# Patient Record
Sex: Female | Born: 2000
Health system: Southern US, Community
[De-identification: ages and names within clinical notes are randomized; demographics above are authoritative.]

## PROBLEM LIST (undated history)

## (undated) DIAGNOSIS — J45909 Unspecified asthma, uncomplicated: Secondary | ICD-10-CM

---

## 2019-02-05 ENCOUNTER — Other Ambulatory Visit: Payer: Self-pay

## 2019-02-05 ENCOUNTER — Ambulatory Visit (HOSPITAL_COMMUNITY)
Admission: EM | Admit: 2019-02-05 | Discharge: 2019-02-05 | Disposition: A | Discharge status: Home / Self Care | Payer: 59 | Attending: Family Medicine | Admitting: Family Medicine

## 2019-02-05 ENCOUNTER — Encounter (HOSPITAL_COMMUNITY): Payer: Self-pay | Admitting: Emergency Medicine

## 2019-02-05 DIAGNOSIS — Z20828 Contact with and (suspected) exposure to other viral communicable diseases: Secondary | ICD-10-CM | POA: Diagnosis not present

## 2019-02-05 DIAGNOSIS — R1084 Generalized abdominal pain: Secondary | ICD-10-CM | POA: Diagnosis not present

## 2019-02-05 DIAGNOSIS — Z3202 Encounter for pregnancy test, result negative: Secondary | ICD-10-CM

## 2019-02-05 DIAGNOSIS — Z03818 Encounter for observation for suspected exposure to other biological agents ruled out: Secondary | ICD-10-CM | POA: Diagnosis present

## 2019-02-05 DIAGNOSIS — Z20822 Contact with and (suspected) exposure to covid-19: Secondary | ICD-10-CM

## 2019-02-05 LAB — POCT URINALYSIS DIP (DEVICE)
Glucose, UA: NEGATIVE mg/dL
Ketones, ur: 80 mg/dL — AB
Leukocytes,Ua: NEGATIVE
Nitrite: NEGATIVE
Protein, ur: 100 mg/dL — AB
Specific Gravity, Urine: >=1.03 (ref 1.005–1.030)
Urobilinogen, UA: 1 mg/dL (ref 0.0–1.0)
pH: 6 (ref 5.0–8.0)

## 2019-02-05 LAB — POC URINE PREG, ED: Preg Test, Ur: NEGATIVE

## 2019-02-05 LAB — POCT PREGNANCY, URINE: Preg Test, Ur: NEGATIVE

## 2019-02-05 MED ORDER — DICYCLOMINE HCL 20 MG PO TABS: 20.0000 mg | ORAL_TABLET | Freq: Two times a day (BID) | ORAL | 0 refills | Status: DC

## 2019-02-05 NOTE — ED Provider Notes (Signed)
[facility]    CSN: [id number] Arrival date & time: 02/05/19  1825      History   Chief Complaint Chief Complaint  Patient presents with  . Abdominal Pain    HPI Karla Cross is a 18 y.o. female.   UXNATFTD Karla Cross 41 y old female presented to the urgent care with a complaint of abdominal pain that started 1day ago.  Denies a precipitating event, or specific injury.  Patient localizes pain to her abdomen.  Describes it as constant and achy, cramping in character.  Has tried OTC Ibuprofen without relief.  Symptoms are made worse with eating.  Did not reports similar symptoms in the past.  Denies fever, chills, appetite change, weight change, chest pain, nausea, vomiting, changes in bowel or bladder habits.  The history is provided by the patient. No language interpreter was used.    History reviewed. No pertinent past medical history.  There are no problems to display for this patient.   History reviewed. No pertinent surgical history.  OB History   No obstetric history on file.      Home Medications    Prior to Admission medications   Medication Sig Start Date End Date Taking? Authorizing Provider  dicyclomine (BENTYL) 20 MG tablet Take 1 tablet (20 mg total) by mouth 2 (two) times daily. 02/05/19   Durward Parcel, FNP    Family History History reviewed. No pertinent family history.  Social History Social History   Tobacco Use  . Smoking status: Never Smoker  . Smokeless tobacco: Never Used  Substance Use Topics  . Alcohol use: Not on file  . Drug use: Not on file     Allergies   Patient has no known allergies.   Review of Systems Review of Systems  Constitutional: Negative.   Respiratory: Positive for chest tightness.   Cardiovascular: Negative.   Gastrointestinal: Positive for abdominal pain.  ROS: All other are negatives   Physical Exam Triage Vital Signs ED Triage Vitals  Enc Vitals Group     BP      Pulse      Resp      Temp      Temp src      SpO2      Weight      Height      Head Circumference      Peak Flow      Pain Score      Pain Loc      Pain Edu?      Excl. in GC?    No data found.  Updated Vital Signs BP 131/83 (BP Location: Right Arm)   Pulse 77   Temp 98.6 F (37 C) (Oral)   Resp 14   LMP 02/05/2019   SpO2 100%   Visual Acuity Right Eye Distance:   Left Eye Distance:   Bilateral Distance:    Right Eye Near:   Left Eye Near:    Bilateral Near:     Physical Exam Constitutional:      General: She is not in acute distress.    Appearance: Normal appearance. She is well-developed and normal weight. She is not ill-appearing or toxic-appearing.  HENT:     Head: Normocephalic.     Right Ear: Tympanic membrane, ear canal and external ear normal. There is no impacted cerumen.     Left Ear: Tympanic membrane, ear canal and external ear normal. There is no impacted cerumen.     Nose: Nose normal.  No congestion.     Mouth/Throat:     Mouth: Mucous membranes are moist.     Pharynx: No oropharyngeal exudate or posterior oropharyngeal erythema.  Cardiovascular:     Rate and Rhythm: Normal rate and regular rhythm.     Pulses: Normal pulses.     Heart sounds: Normal heart sounds. No murmur.  Pulmonary:     Effort: Pulmonary effort is normal. No respiratory distress.     Breath sounds: No wheezing or rhonchi.  Chest:     Chest wall: No tenderness.  Abdominal:     General: Abdomen is flat. Bowel sounds are normal. There is no distension.     Palpations: Abdomen is soft. There is no mass.     Tenderness: There is generalized abdominal tenderness.  Skin:    Capillary Refill: Capillary refill takes less than 2 seconds.  Neurological:     Mental Status: She is alert and oriented to person, place, and time.      UC Treatments / Results  Labs (all labs ordered are listed, but only abnormal results are displayed) Labs Reviewed  POCT URINALYSIS DIP (DEVICE) - Abnormal; Notable  for the following components:      Result Value   Bilirubin Urine MODERATE (*)    Ketones, ur 80 (*)    Hgb urine dipstick LARGE (*)    Protein, ur 100 (*)    All other components within normal limits  URINE CULTURE  NOVEL CORONAVIRUS, NAA (HOSP ORDER, SEND-OUT TO REF LAB; TAT 18-24 HRS)  POC URINE PREG, ED  POC URINE PREG, ED  POCT PREGNANCY, URINE    EKG   Radiology No results found.  Procedures Procedures (including critical care time)  Medications Ordered in UC Medications - No data to display  Initial Impression / Assessment and Plan / UC Course  I have reviewed the triage vital signs and the nursing notes.  Pertinent labs & imaging results that were available during my care of the patient were reviewed by me and considered in my medical decision making (see chart for details).    Urine analysis was inconclusive. Will send urine for culture.  Urine pregnancy test was negative.  COVID-19 test was ordered we will call if result is abnormal.  Will prescribe Bentyl for abdominal pain.  Return for worsening of symptoms.  Final Clinical Impressions(s) / UC Diagnoses   Final diagnoses:  Generalized abdominal pain  COVID-19 ruled out     Discharge Instructions     Urine will be sent for culture Will call on results become available and if abnormal COVID testing ordered.  It will take between 5-7 days for test results.  Someone will contact you regarding abnormal results.    In the meantime: You should remain isolated in your home for 10 days from symptom onset  Get plenty of rest and push fluids Use medications daily for symptom relief Use OTC medications like ibuprofen or tylenol as needed fever or pain Call or go to the ED if you have any new or worsening symptoms such as fever, worsening cough, shortness of breath, chest tightness, chest pain, turning blue, changes in mental status, etc...     ED Prescriptions    Medication Sig Dispense Auth. Provider    dicyclomine (BENTYL) 20 MG tablet Take 1 tablet (20 mg total) by mouth 2 (two) times daily. 20 tablet Tashera Montalvo, Darrelyn Hillock, FNP     PDMP not reviewed this encounter.   Emerson Monte, FNP 02/05/19 2006

## 2019-02-05 NOTE — ED Triage Notes (Signed)
Pt here for abd pain onset last night associated w/n/v/d  Denies fevers  A&O x4... NAD.Marland Kitchen. ambulatory

## 2019-02-05 NOTE — Discharge Instructions (Signed)
Urine will be sent for culture Will call on results become available and if abnormal COVID testing ordered.  It will take between 5-7 days for test results.  Someone will contact you regarding abnormal results.    In the meantime: You should remain isolated in your home for 10 days from symptom onset  Get plenty of rest and push fluids Use medications daily for symptom relief Use OTC medications like ibuprofen or tylenol as needed fever or pain Call or go to the ED if you have any new or worsening symptoms such as fever, worsening cough, shortness of breath, chest tightness, chest pain, turning blue, changes in mental status, etc..Marland Kitchen

## 2019-02-07 LAB — NOVEL CORONAVIRUS, NAA (HOSP ORDER, SEND-OUT TO REF LAB; TAT 18-24 HRS): SARS-CoV-2, NAA: NOT DETECTED

## 2019-02-07 LAB — URINE CULTURE: Culture: <10000 — AB

## 2020-01-22 ENCOUNTER — Other Ambulatory Visit: Payer: Self-pay

## 2020-01-22 ENCOUNTER — Emergency Department (HOSPITAL_COMMUNITY)
Admission: EM | Admit: 2020-01-22 | Discharge: 2020-01-23 | Disposition: A | Discharge status: Home / Self Care | Payer: Medicaid Other | Attending: Emergency Medicine | Admitting: Emergency Medicine

## 2020-01-22 ENCOUNTER — Encounter (HOSPITAL_COMMUNITY): Payer: Self-pay | Admitting: Emergency Medicine

## 2020-01-22 DIAGNOSIS — R21 Rash and other nonspecific skin eruption: Secondary | ICD-10-CM | POA: Diagnosis not present

## 2020-01-22 DIAGNOSIS — Z5321 Procedure and treatment not carried out due to patient leaving prior to being seen by health care provider: Secondary | ICD-10-CM | POA: Diagnosis not present

## 2020-01-22 NOTE — ED Triage Notes (Signed)
Patient reports generalized itchy skin rashes for 1 week , respirations unlabored /no oral swelling , denies fever or chills .

## 2020-01-23 NOTE — ED Notes (Signed)
Pt left due to not being seen quick enough 

## 2020-01-24 ENCOUNTER — Ambulatory Visit (HOSPITAL_COMMUNITY): Admit: 2020-01-24 | Discharge status: Against Medical Advice | Payer: Self-pay

## 2020-01-24 ENCOUNTER — Encounter: Payer: Self-pay | Admitting: Emergency Medicine

## 2020-01-24 ENCOUNTER — Ambulatory Visit
Admission: EM | Admit: 2020-01-24 | Discharge: 2020-01-24 | Disposition: A | Discharge status: Home / Self Care | Payer: Medicaid Other | Attending: Emergency Medicine | Admitting: Emergency Medicine

## 2020-01-24 ENCOUNTER — Other Ambulatory Visit: Payer: Self-pay

## 2020-01-24 DIAGNOSIS — R21 Rash and other nonspecific skin eruption: Secondary | ICD-10-CM

## 2020-01-24 MED ORDER — TRIAMCINOLONE ACETONIDE 0.1 % EX CREA: 1.0000 "application " | TOPICAL_CREAM | Freq: Two times a day (BID) | CUTANEOUS | 0 refills | Status: DC

## 2020-01-24 MED ORDER — PERMETHRIN 5 % EX CREA: TOPICAL_CREAM | CUTANEOUS | 0 refills | Status: DC

## 2020-01-24 NOTE — ED Provider Notes (Signed)
[facility]    CSN: [id number] Arrival date & time: 01/24/20  1324      History   Chief Complaint Chief Complaint  Patient presents with  . Rash    HPI Karla Cross is a 19 y.o. female presenting today for evaluation of a rash.  Patient reports that she has had generalized skin itching and rash for approximately 1 week.  Initially had area to left upper arm that has since resolved, lasted for approximately 3 days.  Has developed similar to left medial thigh.  Concerned as roommate also now with a rash.  Reports history of eczema.  Denies any new exposures or hygiene products.  HPI  History reviewed. No pertinent past medical history.  There are no problems to display for this patient.   History reviewed. No pertinent surgical history.  OB History   No obstetric history on file.      Home Medications    Prior to Admission medications   Medication Sig Start Date End Date Taking? Authorizing Provider  dicyclomine (BENTYL) 20 MG tablet Take 1 tablet (20 mg total) by mouth 2 (two) times daily. 02/05/19   Avegno, Zachery Dakins, FNP  permethrin (ELIMITE) 5 % cream Apply to affected area once 01/24/20   Amiracle Neises C, PA-C  triamcinolone (KENALOG) 0.1 % Apply 1 application topically 2 (two) times daily. 01/24/20   Jennifer Payes, Junius Creamer, PA-C    Family History Family History  Problem Relation Age of Onset  . Healthy Mother     Social History Social History   Tobacco Use  . Smoking status: Never Smoker  . Smokeless tobacco: Never Used  Substance Use Topics  . Alcohol use: Never  . Drug use: Never     Allergies   Patient has no known allergies.   Review of Systems Review of Systems  Constitutional: Negative for fatigue and fever.  HENT: Negative for mouth sores.   Eyes: Negative for visual disturbance.  Respiratory: Negative for shortness of breath.   Cardiovascular: Negative for chest pain.  Gastrointestinal: Negative for abdominal pain,  nausea and vomiting.  Genitourinary: Negative for genital sores.  Musculoskeletal: Negative for arthralgias and joint swelling.  Skin: Positive for color change and rash. Negative for wound.  Neurological: Negative for dizziness, weakness, light-headedness and headaches.     Physical Exam Triage Vital Signs ED Triage Vitals  Enc Vitals Group     BP      Pulse      Resp      Temp      Temp src      SpO2      Weight      Height      Head Circumference      Peak Flow      Pain Score      Pain Loc      Pain Edu?      Excl. in GC?    No data found.  Updated Vital Signs BP 122/79 (BP Location: Left Arm)   Pulse 70   Temp 98.1 F (36.7 C) (Oral)   Resp 16   LMP 01/15/2020   SpO2 99%   Visual Acuity Right Eye Distance:   Left Eye Distance:   Bilateral Distance:    Right Eye Near:   Left Eye Near:    Bilateral Near:     Physical Exam Vitals and nursing note reviewed.  Constitutional:      Appearance: She is well-developed and well-nourished.  Comments: No acute distress  HENT:     Head: Normocephalic and atraumatic.     Nose: Nose normal.  Eyes:     Conjunctiva/sclera: Conjunctivae normal.  Cardiovascular:     Rate and Rhythm: Normal rate.  Pulmonary:     Effort: Pulmonary effort is normal. No respiratory distress.  Abdominal:     General: There is no distension.  Musculoskeletal:        General: Normal range of motion.     Cervical back: Neck supple.  Skin:    General: Skin is warm and dry.     Comments: Faintly papular erythematous rash to localized area of medial thigh, area on left upper arm resolved without scarring  Neurological:     Mental Status: She is alert and oriented to person, place, and time.  Psychiatric:        Mood and Affect: Mood and affect normal.      UC Treatments / Results  Labs (all labs ordered are listed, but only abnormal results are displayed) Labs Reviewed - No data to display  EKG   Radiology No results  found.  Procedures Procedures (including critical care time)  Medications Ordered in UC Medications - No data to display  Initial Impression / Assessment and Plan / UC Course  I have reviewed the triage vital signs and the nursing notes.  Pertinent labs & imaging results that were available during my care of the patient were reviewed by me and considered in my medical decision making (see chart for details).     Unclear cause of rash at this time, remits rash suggestive of possible scabies we will go ahead and cover for this with permethrin.  Triamcinolone topically to help with itching and localized inflammation.  Continue to monitor.  Discussed strict return precautions. Patient verbalized understanding and is agreeable with plan.  Final Clinical Impressions(s) / UC Diagnoses   Final diagnoses:  Rash and nonspecific skin eruption     Discharge Instructions     Please use triamcinolone cream twice daily to rash Follow-up if not improving or worsening  Thoroughly massage cream (30 g for average adult) from head to soles of feet; leave on for 8 to 14 hours before removing (shower or bath); for elderly patients, also apply on the hairline, neck, scalp, temple, and forehead; may repeat if living mites are observed 14 days after first treatment; one application is generally curative.    ED Prescriptions    Medication Sig Dispense Auth. Provider   triamcinolone (KENALOG) 0.1 % Apply 1 application topically 2 (two) times daily. 45 g Ginnette Gates C, PA-C   permethrin (ELIMITE) 5 % cream Apply to affected area once 60 g Cincere Zorn, [place] C, PA-C     PDMP not reviewed this encounter.   Lew Dawes, PA-C 01/24/20 1515

## 2020-01-24 NOTE — Discharge Instructions (Addendum)
Please use triamcinolone cream twice daily to rash Follow-up if not improving or worsening  Thoroughly massage cream (30 g for average adult) from head to soles of feet; leave on for 8 to 14 hours before removing (shower or bath); for elderly patients, also apply on the hairline, neck, scalp, temple, and forehead; may repeat if living mites are observed 14 days after first treatment; one application is generally curative. 

## 2020-01-24 NOTE — ED Triage Notes (Signed)
Pt here for rash to left arm and upper inner thigh x 3 days

## 2020-04-14 ENCOUNTER — Encounter (HOSPITAL_COMMUNITY): Payer: Self-pay | Admitting: Emergency Medicine

## 2020-04-14 ENCOUNTER — Other Ambulatory Visit: Payer: Self-pay

## 2020-04-14 ENCOUNTER — Ambulatory Visit (HOSPITAL_COMMUNITY)
Admission: EM | Admit: 2020-04-14 | Discharge: 2020-04-14 | Disposition: A | Discharge status: Home / Self Care | Payer: Medicaid Other | Attending: Family Medicine | Admitting: Family Medicine

## 2020-04-14 DIAGNOSIS — M79621 Pain in right upper arm: Secondary | ICD-10-CM | POA: Diagnosis not present

## 2020-04-14 DIAGNOSIS — S40021A Contusion of right upper arm, initial encounter: Secondary | ICD-10-CM

## 2020-04-14 NOTE — ED Triage Notes (Signed)
Pt presents today with c/o of right arm pain, bruising described as throbbing x 3 days. Denies injury.

## 2020-04-14 NOTE — ED Provider Notes (Signed)
  [facility]   [id number] 04/14/20 Arrival Time: 1546  ASSESSMENT & PLAN:  1. Pain in right upper arm   2. Arm bruise, right, initial encounter     No hematoma. Discussed typical bruise healing time. Ice if needed. Ibup if needed.    Follow-up Information    [facility].   Specialty: Urgent Care Why: If worsening or failing to improve as anticipated. Contact information: [address] [place] [address] [phone]              Reviewed expectations re: course of current medical issues. Questions answered. Outlined signs and symptoms indicating need for more acute intervention. Understanding verbalized. After Visit Summary given.   SUBJECTIVE: History from: patient. CZYSAYTK Bounds is a 20 y.o. female who reports R upper arm pain over triceps; few days; noted bruise to area yesterday. Does not recall trauma to area. No bruising elsewhere. Feels well.    OBJECTIVE:  Vitals:   04/14/20 1616  BP: 118/69  Pulse: 67  Resp: 20  Temp: 98.2 F (36.8 C)  TempSrc: Oral  SpO2: 100%    General appearance: alert; no distress Eyes: PERRLA; EOMI; conjunctiva normal HENT: IXL; AT Neck: supple  Lungs: speaks full sentences without difficulty; unlabored Extremities: no edema; RUE with bruising over triceps measuring approx 2x4 cm; is TTP; no swelling; no hematoma; no open wounds Skin: warm and dry Neurologic: normal gait Psychological: alert and cooperative; normal mood and affect   No Known Allergies  History reviewed. No pertinent past medical history. Social History   Socioeconomic History  . Marital status: Single    Spouse name: Not on file  . Number of children: Not on file  . Years of education: Not on file  . Highest education level: Not on file  Occupational History  . Not on file  Tobacco Use  . Smoking status: Never Smoker  . Smokeless tobacco: Never Used  Vaping Use  . Vaping Use: Former   Substance and Sexual Activity  . Alcohol use: Never  . Drug use: Never  . Sexual activity: Not on file  Other Topics Concern  . Not on file  Social History Narrative  . Not on file   Social Determinants of Health   Financial Resource Strain: Not on file  Food Insecurity: Not on file  Transportation Needs: Not on file  Physical Activity: Not on file  Stress: Not on file  Social Connections: Not on file  Intimate Partner Violence: Not on file   Family History  Problem Relation Age of Onset  . Healthy Mother    History reviewed. No pertinent surgical history.   Mardella Layman, MD 04/14/20 [phone]

## 2020-04-14 NOTE — Discharge Instructions (Addendum)
If not allergic, you may use over the counter ibuprofen or acetaminophen as needed. ° °

## 2020-05-11 ENCOUNTER — Ambulatory Visit (HOSPITAL_COMMUNITY)
Admission: EM | Admit: 2020-05-11 | Discharge: 2020-05-11 | Disposition: A | Discharge status: Home / Self Care | Payer: Medicaid Other

## 2020-05-11 ENCOUNTER — Encounter (HOSPITAL_COMMUNITY): Payer: Self-pay | Admitting: Emergency Medicine

## 2020-05-11 ENCOUNTER — Ambulatory Visit (INDEPENDENT_AMBULATORY_CARE_PROVIDER_SITE_OTHER): Payer: Medicaid Other

## 2020-05-11 ENCOUNTER — Other Ambulatory Visit: Payer: Self-pay

## 2020-05-11 DIAGNOSIS — R059 Cough, unspecified: Secondary | ICD-10-CM | POA: Diagnosis not present

## 2020-05-11 DIAGNOSIS — J069 Acute upper respiratory infection, unspecified: Secondary | ICD-10-CM

## 2020-05-11 DIAGNOSIS — R079 Chest pain, unspecified: Secondary | ICD-10-CM

## 2020-05-11 DIAGNOSIS — R0789 Other chest pain: Secondary | ICD-10-CM

## 2020-05-11 DIAGNOSIS — F17200 Nicotine dependence, unspecified, uncomplicated: Secondary | ICD-10-CM | POA: Diagnosis not present

## 2020-05-11 DIAGNOSIS — J4530 Mild persistent asthma, uncomplicated: Secondary | ICD-10-CM | POA: Diagnosis not present

## 2020-05-11 HISTORY — DX: Unspecified asthma, uncomplicated: J45.909

## 2020-05-11 MED ORDER — PREDNISONE 20 MG PO TABS: ORAL_TABLET | ORAL | 0 refills | Status: DC

## 2020-05-11 MED ORDER — PROMETHAZINE-DM 6.25-15 MG/5ML PO SYRP: 5.0000 mL | ORAL_SOLUTION | Freq: Every evening | ORAL | 0 refills | Status: DC | PRN

## 2020-05-11 MED ORDER — BENZONATATE 100 MG PO CAPS: 100.0000 mg | ORAL_CAPSULE | Freq: Three times a day (TID) | ORAL | 0 refills | Status: DC | PRN

## 2020-05-11 NOTE — ED Triage Notes (Addendum)
Patient reports cough for 5 days.  Patient complains of head congestion and left lower rib cage pain, swelling?  Denies fever, but having hot and cold sweats

## 2020-05-11 NOTE — ED Provider Notes (Signed)
[facility]   MRN: [id number] DOB: 2000-10-09  Subjective:   KXFGHWEX Karla Cross is a 20 y.o. female presenting for 5-day history of acute onset sinus congestion, productive cough, chest tightness, lower chest pain and swelling, subjective fever, cold sweats.  Patient has a history of asthma, has been using her inhaler.  She also smokes black and milds.  Is not interested in Covid testing.  No current facility-administered medications for this encounter.  Current Outpatient Medications:  .  escitalopram (LEXAPRO) 10 MG tablet, Take 10 mg by mouth daily., Disp: , Rfl:  .  FLOVENT HFA 110 MCG/ACT inhaler, SMARTSIG:2 Puff(s) By Mouth Twice Daily, Disp: , Rfl:  .  PROAIR HFA 108 (90 Base) MCG/ACT inhaler, SMARTSIG:2 Puff(s) By Mouth Every 6 Hours, Disp: , Rfl:    No Known Allergies  Past Medical History:  Diagnosis Date  . Asthma      History reviewed. No pertinent surgical history.  Family History  Problem Relation Age of Onset  . Healthy Mother     Social History   Tobacco Use  . Smoking status: Current Some Day Smoker    Types: Cigarettes  . Smokeless tobacco: Never Used  Vaping Use  . Vaping Use: Former  Substance Use Topics  . Alcohol use: Never  . Drug use: Never    ROS   Objective:   Vitals: BP 118/84 (BP Location: Left Arm)   Pulse 75   Temp 98.5 F (36.9 C) (Oral)   Resp 18   LMP 05/04/2020   SpO2 99%   Physical Exam Constitutional:      General: She is not in acute distress.    Appearance: Normal appearance. She is well-developed. She is not ill-appearing, toxic-appearing or diaphoretic.  HENT:     Head: Normocephalic and atraumatic.     Right Ear: Tympanic membrane, ear canal and external ear normal. No drainage or tenderness. No middle ear effusion. Tympanic membrane is not erythematous.     Left Ear: Tympanic membrane, ear canal and external ear normal. No drainage or tenderness.  No middle ear effusion. Tympanic membrane is not  erythematous.     Nose: Nose normal. No congestion or rhinorrhea.     Mouth/Throat:     Mouth: Mucous membranes are moist. No oral lesions.     Pharynx: No pharyngeal swelling, oropharyngeal exudate, posterior oropharyngeal erythema or uvula swelling.     Tonsils: No tonsillar exudate or tonsillar abscesses.  Eyes:     General: No scleral icterus.       Right eye: No discharge.        Left eye: No discharge.     Extraocular Movements: Extraocular movements intact.     Right eye: Normal extraocular motion.     Left eye: Normal extraocular motion.     Conjunctiva/sclera: Conjunctivae normal.     Pupils: Pupils are equal, round, and reactive to light.  Cardiovascular:     Rate and Rhythm: Normal rate and regular rhythm.     Pulses: Normal pulses.     Heart sounds: Normal heart sounds. No murmur heard. No friction rub. No gallop.   Pulmonary:     Effort: Pulmonary effort is normal. No respiratory distress.     Breath sounds: Normal breath sounds. No stridor. No wheezing, rhonchi or rales.  Musculoskeletal:     Cervical back: Normal range of motion and neck supple.  Lymphadenopathy:     Cervical: No cervical adenopathy.  Skin:    General:  Skin is warm and dry.     Findings: No rash.  Neurological:     General: No focal deficit present.     Mental Status: She is alert and oriented to person, place, and time.  Psychiatric:        Mood and Affect: Mood normal.        Behavior: Behavior normal.        Thought Content: Thought content normal.        Judgment: Judgment normal.    DG Chest 2 View  Result Date: 05/11/2020 CLINICAL DATA:  Chest pain and cough EXAM: CHEST - 2 VIEW COMPARISON:  None. FINDINGS: The lungs are clear. Heart size and pulmonary vascularity are normal. No adenopathy. No pneumothorax. No bone lesions. IMPRESSION: Lungs clear.  Cardiac silhouette normal. Electronically Signed   By: Bretta Bang III M.D.   On: 05/11/2020 15:26     Assessment and Plan :    PDMP not reviewed this encounter.  1. Viral URI with cough   2. Mild persistent asthma without complication   3. Atypical chest pain   4. Smoker     Recommended prednisone course in light of her chest symptoms and productive cough.  Patient declined Covid test.  Emphasized need to quit smoking.  Schedule butyryl inhaler, use supportive care otherwise. Counseled patient on potential for adverse effects with medications prescribed/recommended today, ER and return-to-clinic precautions discussed, patient verbalized understanding.    Wallis Bamberg, [place] 05/11/20 [phone]

## 2020-07-03 ENCOUNTER — Encounter (HOSPITAL_COMMUNITY): Payer: Self-pay | Admitting: Emergency Medicine

## 2020-07-03 ENCOUNTER — Ambulatory Visit (HOSPITAL_COMMUNITY)
Admission: EM | Admit: 2020-07-03 | Discharge: 2020-07-03 | Disposition: A | Discharge status: Home / Self Care | Payer: Medicaid Other | Attending: Family Medicine | Admitting: Family Medicine

## 2020-07-03 ENCOUNTER — Other Ambulatory Visit: Payer: Self-pay

## 2020-07-03 DIAGNOSIS — B9689 Other specified bacterial agents as the cause of diseases classified elsewhere: Secondary | ICD-10-CM | POA: Diagnosis not present

## 2020-07-03 DIAGNOSIS — N760 Acute vaginitis: Secondary | ICD-10-CM | POA: Insufficient documentation

## 2020-07-03 LAB — POCT URINALYSIS DIPSTICK, ED / UC
Bilirubin Urine: NEGATIVE
Glucose, UA: 100 mg/dL — AB
Hgb urine dipstick: NEGATIVE
Leukocytes,Ua: NEGATIVE
Nitrite: NEGATIVE
Protein, ur: NEGATIVE mg/dL
Specific Gravity, Urine: >=1.03 (ref 1.005–1.030)
Urobilinogen, UA: 0.2 mg/dL (ref 0.0–1.0)
pH: 5.5 (ref 5.0–8.0)

## 2020-07-03 LAB — POC URINE PREG, ED: Preg Test, Ur: NEGATIVE

## 2020-07-03 MED ORDER — FLUCONAZOLE 150 MG PO TABS: 150.0000 mg | ORAL_TABLET | ORAL | 0 refills | Status: DC

## 2020-07-03 NOTE — ED Provider Notes (Signed)
[facility]    CSN: [id number] Arrival date & time: 07/03/20  1636      History   Chief Complaint Chief Complaint  Patient presents with  . Vaginal Discharge    HPI Nykerria Courser is a 20 y.o. female.   Patient presenting today with several day history of white discharge, vaginal itching and irritation.  She denies pelvic pain, dysuria, hematuria, flank pain, fever, chills, new sexual partners.  So far doing over-the-counter Monistat with minimal benefit.  States she had something similar in 6 months or so ago and was diagnosed with a yeast infection.  Of note, her menstrual cycle is 3 days late.  She has not taken a pregnancy test yet.     Past Medical History:  Diagnosis Date  . Asthma     There are no problems to display for this patient.   History reviewed. No pertinent surgical history.  OB History   No obstetric history on file.      Home Medications    Prior to Admission medications   Medication Sig Start Date End Date Taking? Authorizing Provider  fluconazole (DIFLUCAN) 150 MG tablet Take 1 tablet (150 mg total) by mouth every other day. May stop once symptoms resolve 07/03/20  Yes Particia Nearing, PA-C  benzonatate (TESSALON) 100 MG capsule Take 1-2 capsules (100-200 mg total) by mouth 3 (three) times daily as needed for cough. Patient not taking: Reported on 07/03/2020 05/11/20   Wallis Bamberg, PA-C  escitalopram (LEXAPRO) 10 MG tablet Take 10 mg by mouth daily. Patient not taking: Reported on 07/03/2020 05/03/20   [provider]  FLOVENT HFA 110 MCG/ACT inhaler SMARTSIG:2 Puff(s) By Mouth Twice Daily 05/05/20   [provider]  predniSONE (DELTASONE) 20 MG tablet Take 2 tablets daily with breakfast. Patient not taking: Reported on 07/03/2020 05/11/20   Beverly Sessions  PROAIR HFA 108 [phone] Base) MCG/ACT inhaler SMARTSIG:2 Puff(s) By Mouth Every 6 Hours 05/07/20   [provider]  promethazine-dextromethorphan  (PROMETHAZINE-DM) 6.25-15 MG/5ML syrup Take 5 mLs by mouth at bedtime as needed for cough. Patient not taking: Reported on 07/03/2020 05/11/20   Wallis Bamberg, PA-C  dicyclomine (BENTYL) 20 MG tablet Take 1 tablet (20 mg total) by mouth 2 (two) times daily. 02/05/19 04/14/20  Durward Parcel, FNP    Family History Family History  Problem Relation Age of Onset  . Healthy Mother     Social History Social History   Tobacco Use  . Smoking status: Current Some Day Smoker    Types: Cigarettes  . Smokeless tobacco: Never Used  Vaping Use  . Vaping Use: Former  Substance Use Topics  . Alcohol use: Never  . Drug use: Never     Allergies   Patient has no known allergies.   Review of Systems Review of Systems Per HPI  Physical Exam Triage Vital Signs ED Triage Vitals  Enc Vitals Group     BP 07/03/20 1735 131/78     Pulse Rate 07/03/20 1735 77     Resp 07/03/20 1735 18     Temp 07/03/20 1735 98.2 F (36.8 C)     Temp Source 07/03/20 1735 Oral     SpO2 07/03/20 1735 99 %     Weight --      Height --      Head Circumference --      Peak Flow --      Pain Score 07/03/20 1732 0     Pain  Loc --      Pain Edu? --      Excl. in GC? --    No data found.  Updated Vital Signs BP 131/78 (BP Location: Left Arm)   Pulse 77   Temp 98.2 F (36.8 C) (Oral)   Resp 18   LMP 06/01/2020   SpO2 99%   Visual Acuity Right Eye Distance:   Left Eye Distance:   Bilateral Distance:    Right Eye Near:   Left Eye Near:    Bilateral Near:     Physical Exam Vitals and nursing note reviewed.  Constitutional:      Appearance: Normal appearance. She is not ill-appearing.  HENT:     Head: Atraumatic.  Eyes:     Extraocular Movements: Extraocular movements intact.     Conjunctiva/sclera: Conjunctivae normal.  Cardiovascular:     Rate and Rhythm: Normal rate and regular rhythm.     Heart sounds: Normal heart sounds.  Pulmonary:     Effort: Pulmonary effort is normal.     Breath  sounds: Normal breath sounds.  Abdominal:     General: Bowel sounds are normal. There is no distension.     Palpations: Abdomen is soft.     Tenderness: There is no abdominal tenderness. There is no guarding.  Genitourinary:    Comments: GU exam deferred, self swab performed Musculoskeletal:        General: Normal range of motion.     Cervical back: Normal range of motion and neck supple.  Skin:    General: Skin is warm and dry.  Neurological:     Mental Status: She is alert and oriented to person, place, and time.  Psychiatric:        Mood and Affect: Mood normal.        Thought Content: Thought content normal.        Judgment: Judgment normal.    UC Treatments / Results  Labs (all labs ordered are listed, but only abnormal results are displayed) Labs Reviewed  POCT URINALYSIS DIPSTICK, ED / UC - Abnormal; Notable for the following components:      Result Value   Glucose, UA 100 (*)    Ketones, ur TRACE (*)    All other components within normal limits  POC URINE PREG, ED  CERVICOVAGINAL ANCILLARY ONLY   EKG   Radiology No results found.  Procedures Procedures (including critical care time)  Medications Ordered in UC Medications - No data to display  Initial Impression / Assessment and Plan / UC Course  I have reviewed the triage vital signs and the nursing notes.  Pertinent labs & imaging results that were available during my care of the patient were reviewed by me and considered in my medical decision making (see chart for details).     Vitals and exam reassuring, UA and urine pregnancy negative.  Will send Diflucan while awaiting vaginal swab results.  Treat based on these otherwise.  Boric acid and good vaginal hygiene reviewed.  Final Clinical Impressions(s) / UC Diagnoses   Final diagnoses:  Acute vaginitis   Discharge Instructions   None    ED Prescriptions    Medication Sig Dispense Auth. Provider   fluconazole (DIFLUCAN) 150 MG tablet Take 1  tablet (150 mg total) by mouth every other day. May stop once symptoms resolve 3 tablet Particia Nearing, [place]     PDMP not reviewed this encounter.   Particia Nearing, [place] 07/03/20 1823

## 2020-07-03 NOTE — ED Triage Notes (Signed)
Vaginal discharge and irritation.  Had a similar episode in April diagnosed as yeast.  This episode returned 2 weeks ago.  Patient has tried monistat and an otc pill.  These items did not help

## 2020-07-05 LAB — CERVICOVAGINAL ANCILLARY ONLY
Bacterial Vaginitis (gardnerella): POSITIVE — AB
Candida Glabrata: NEGATIVE
Candida Vaginitis: POSITIVE — AB
Chlamydia: NEGATIVE
Comment: NEGATIVE
Comment: NEGATIVE
Comment: NEGATIVE
Comment: NEGATIVE
Comment: NEGATIVE
Comment: NORMAL
Neisseria Gonorrhea: NEGATIVE
Trichomonas: NEGATIVE

## 2020-07-06 ENCOUNTER — Telehealth (HOSPITAL_COMMUNITY): Payer: Self-pay | Admitting: Emergency Medicine

## 2020-07-06 MED ORDER — METRONIDAZOLE 500 MG PO TABS: 500.0000 mg | ORAL_TABLET | Freq: Two times a day (BID) | ORAL | 0 refills | Status: DC

## 2020-08-02 ENCOUNTER — Other Ambulatory Visit: Payer: Self-pay

## 2020-08-02 ENCOUNTER — Ambulatory Visit (INDEPENDENT_AMBULATORY_CARE_PROVIDER_SITE_OTHER): Payer: Medicaid Other

## 2020-08-02 ENCOUNTER — Encounter: Payer: Self-pay | Admitting: Emergency Medicine

## 2020-08-02 ENCOUNTER — Ambulatory Visit
Admission: EM | Admit: 2020-08-02 | Discharge: 2020-08-02 | Disposition: A | Discharge status: Home / Self Care | Payer: Medicaid Other | Attending: Emergency Medicine | Admitting: Emergency Medicine

## 2020-08-02 DIAGNOSIS — S63502A Unspecified sprain of left wrist, initial encounter: Secondary | ICD-10-CM | POA: Diagnosis not present

## 2020-08-02 DIAGNOSIS — M25532 Pain in left wrist: Secondary | ICD-10-CM | POA: Diagnosis not present

## 2020-08-02 MED ORDER — IBUPROFEN 600 MG PO TABS: 600.0000 mg | ORAL_TABLET | Freq: Four times a day (QID) | ORAL | 0 refills | Status: DC | PRN

## 2020-08-02 NOTE — ED Triage Notes (Signed)
Pt here for left wrist pain after injuring lifting heavy boxes 3 days ago

## 2020-08-02 NOTE — Discharge Instructions (Addendum)
Wear wrist brace 24/7 over the next week, then go to using only at work with lifting, and transition out of symptoms improving Tylenol and ibuprofen for pain Ice twice daily Follow-up with sports medicine if not improving

## 2020-08-02 NOTE — ED Provider Notes (Signed)
[facility]    CSN: [id number] Arrival date & time: 08/02/20  1023      History   Chief Complaint Chief Complaint  Patient presents with   Wrist Pain    HPI Karla Cross is a 20 y.o. female history of asthma presenting today for evaluation of left wrist pain.  Reports injured wrist lifting heavy boxes approximately 3 days ago.  Denies fall or direct trauma.  Since she has felt a clicking sensation in her wrist.  Mainly mainly on the ulnar side.  Occasional numbness tingling sensation into hand.  HPI  Past Medical History:  Diagnosis Date   Asthma     There are no problems to display for this patient.   History reviewed. No pertinent surgical history.  OB History   No obstetric history on file.      Home Medications    Prior to Admission medications   Medication Sig Start Date End Date Taking? Authorizing Provider  ibuprofen (ADVIL) 600 MG tablet Take 1 tablet (600 mg total) by mouth every 6 (six) hours as needed. 08/02/20  Yes Maximilian Tallo, Whitney Muse  FLOVENT HFA 110 MCG/ACT inhaler SMARTSIG:2 Puff(s) By Mouth Twice Daily 05/05/20   [provider]  PROAIR HFA 108 [phone] Base) MCG/ACT inhaler SMARTSIG:2 Puff(s) By Mouth Every 6 Hours 05/07/20   [provider]  dicyclomine (BENTYL) 20 MG tablet Take 1 tablet (20 mg total) by mouth 2 (two) times daily. 02/05/19 04/14/20  Durward Parcel, FNP    Family History Family History  Problem Relation Age of Onset   Healthy Mother     Social History Social History   Tobacco Use   Smoking status: Some Days    Pack years: 0.00    Types: Cigarettes   Smokeless tobacco: Never  Vaping Use   Vaping Use: Former  Substance Use Topics   Alcohol use: Never   Drug use: Never     Allergies   Patient has no known allergies.   Review of Systems Review of Systems  Constitutional:  Negative for fatigue and fever.  HENT:  Negative for mouth sores.   Eyes:  Negative for visual disturbance.   Respiratory:  Negative for shortness of breath.   Cardiovascular:  Negative for chest pain.  Gastrointestinal:  Negative for abdominal pain, nausea and vomiting.  Genitourinary:  Negative for genital sores.  Musculoskeletal:  Positive for arthralgias. Negative for joint swelling.  Skin:  Negative for color change, rash and wound.  Neurological:  Negative for dizziness, weakness, light-headedness and headaches.    Physical Exam Triage Vital Signs ED Triage Vitals  Enc Vitals Group     BP 08/02/20 1120 118/73     Pulse Rate 08/02/20 1120 83     Resp 08/02/20 1120 18     Temp 08/02/20 1120 98.1 F (36.7 C)     Temp Source 08/02/20 1120 Oral     SpO2 08/02/20 1120 98 %     Weight --      Height --      Head Circumference --      Peak Flow --      Pain Score 08/02/20 1122 5     Pain Loc --      Pain Edu? --      Excl. in GC? --    No data found.  Updated Vital Signs BP 118/73   Pulse 83   Temp 98.1 F (36.7 C) (Oral)   Resp 18  SpO2 98%   Visual Acuity Right Eye Distance:   Left Eye Distance:   Bilateral Distance:    Right Eye Near:   Left Eye Near:    Bilateral Near:     Physical Exam Vitals and nursing note reviewed.  Constitutional:      Appearance: She is well-developed.     Comments: No acute distress  HENT:     Head: Normocephalic and atraumatic.     Nose: Nose normal.  Eyes:     Conjunctiva/sclera: Conjunctivae normal.  Cardiovascular:     Rate and Rhythm: Normal rate.  Pulmonary:     Effort: Pulmonary effort is normal. No respiratory distress.  Abdominal:     General: There is no distension.  Musculoskeletal:        General: Normal range of motion.     Cervical back: Neck supple.     Comments: Left wrist: No obvious swelling deformity, no discoloration, tender to palpation to the distal ulna and slight discomfort to distal radius, no snuffbox tenderness, nontender throughout metacarpals, full active range of motion of all 5 fingers at DIP PIP  and MCP joints, radial pulse 2+  Skin:    General: Skin is warm and dry.  Neurological:     Mental Status: She is alert and oriented to person, place, and time.     UC Treatments / Results  Labs (all labs ordered are listed, but only abnormal results are displayed) Labs Reviewed - No data to display  EKG   Radiology DG Wrist Complete Left  Result Date: 08/02/2020 CLINICAL DATA:  Ulnar wrist pain, lifting heavy boxes EXAM: LEFT WRIST - COMPLETE 3+ VIEW COMPARISON:  None. FINDINGS: There is no evidence of fracture or dislocation. There is no evidence of arthropathy or other focal bone abnormality. Soft tissues are unremarkable. IMPRESSION: No fracture or dislocation of the left wrist. The carpus is normally aligned. Joint spaces are preserved. Electronically Signed   By: Lauralyn Primes M.D.   On: 08/02/2020 11:55    Procedures Procedures (including critical care time)  Medications Ordered in UC Medications - No data to display  Initial Impression / Assessment and Plan / UC Course  I have reviewed the triage vital signs and the nursing notes.  Pertinent labs & imaging results that were available during my care of the patient were reviewed by me and considered in my medical decision making (see chart for details).     Left wrist sprain-placing in wrist brace, recommending anti-inflammatories, ice and monitor for gradual resolution over the next 1 to 2 weeks. Discussed strict return precautions. Patient verbalized understanding and is agreeable with plan.  Final Clinical Impressions(s) / UC Diagnoses   Final diagnoses:  Sprain of left wrist, initial encounter     Discharge Instructions      Wear wrist brace 24/7 over the next week, then go to using only at work with lifting, and transition out of symptoms improving Tylenol and ibuprofen for pain Ice twice daily Follow-up with sports medicine if not improving     ED Prescriptions     Medication Sig Dispense Auth.  Provider   ibuprofen (ADVIL) 600 MG tablet Take 1 tablet (600 mg total) by mouth every 6 (six) hours as needed. 30 tablet [name], [place] C, PA-C      PDMP not reviewed this encounter.   Lew Dawes, [place] 08/02/20 1242

## 2021-07-05 IMAGING — DX DG WRIST COMPLETE 3+V*L*
3 series · 3 of 3 positions shown · non-contrast
Comparison: None.

CLINICAL DATA: Ulnar wrist pain, lifting heavy boxes

EXAM:
LEFT WRIST - COMPLETE 3+ VIEW

[wrist pa (1 of 2)]
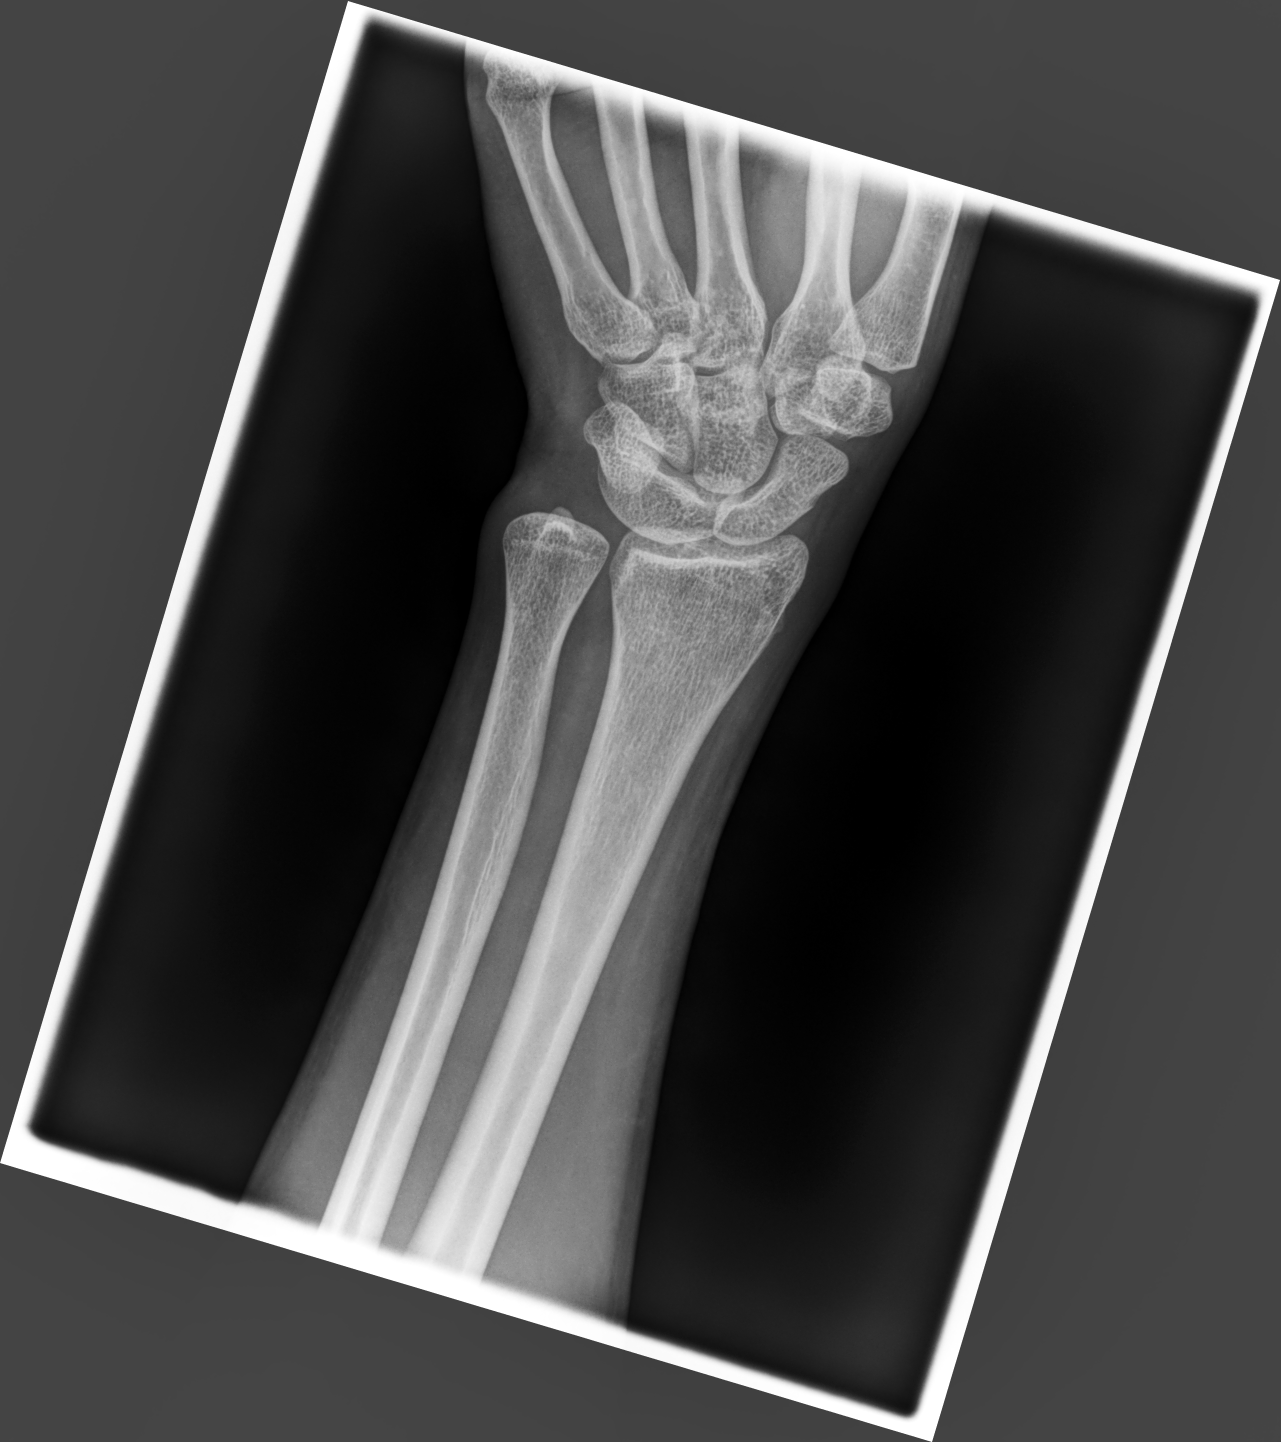

[wrist lat]
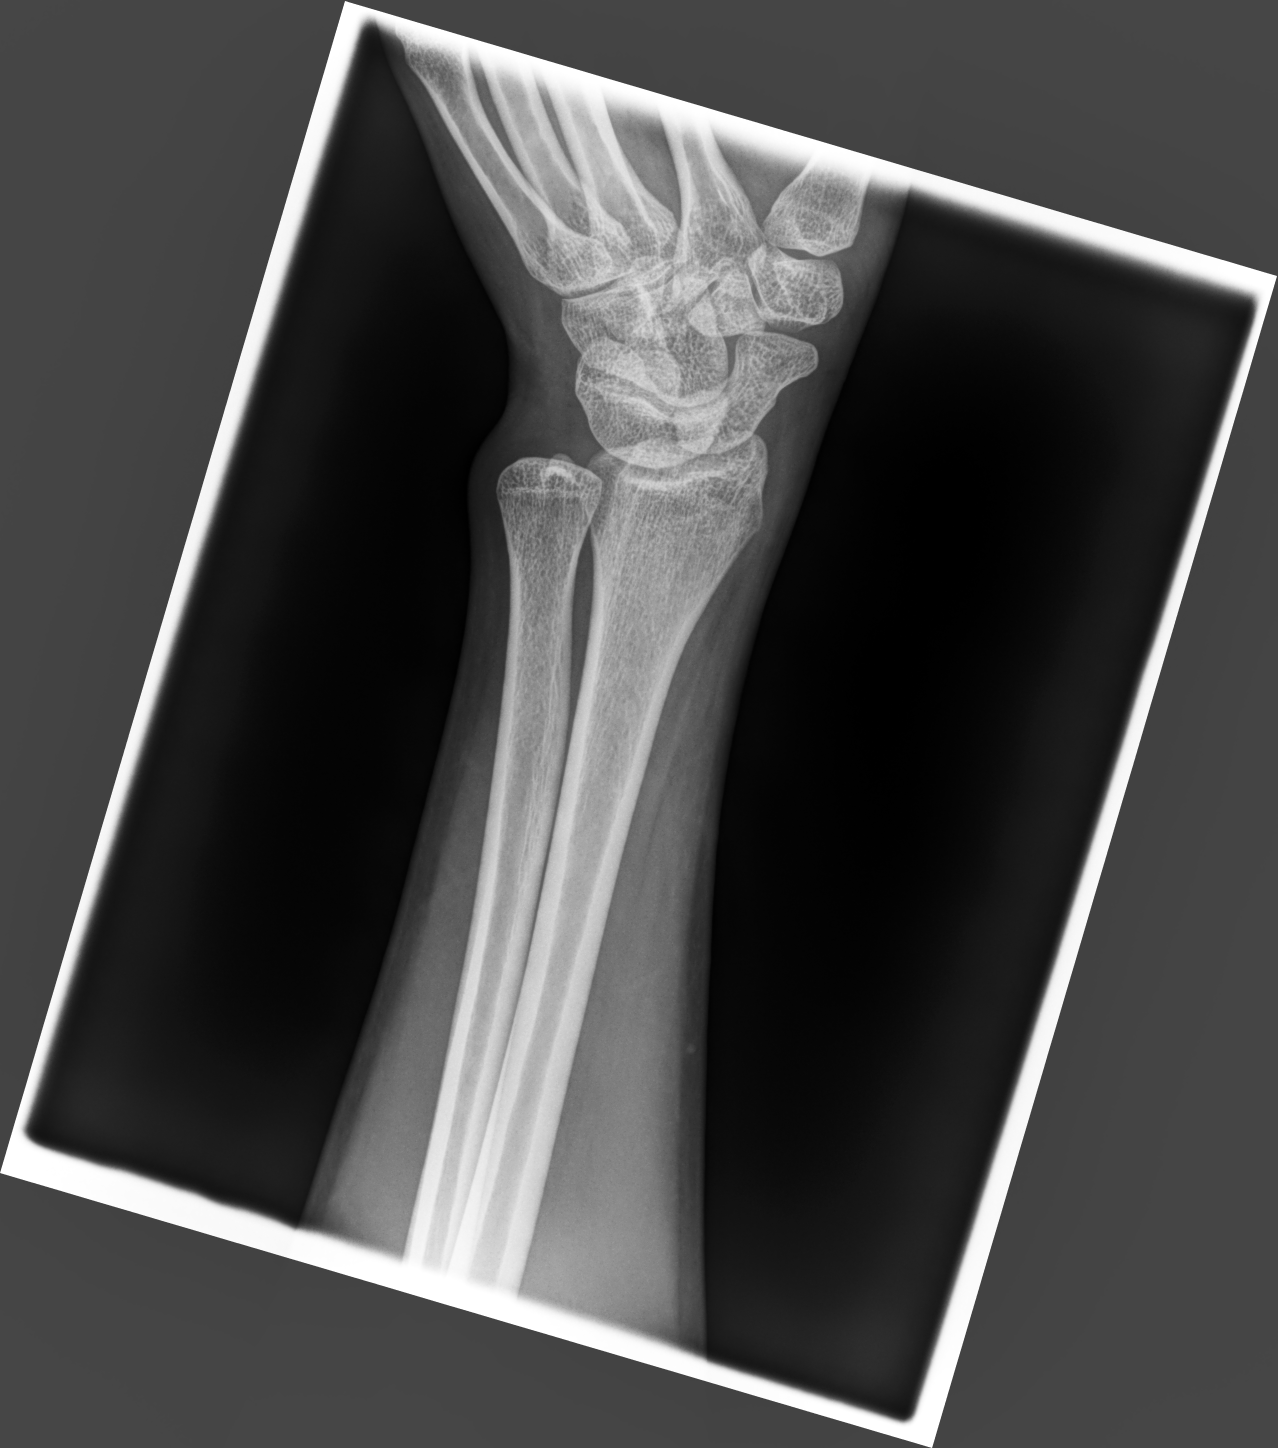

[wrist pa (2 of 2)]
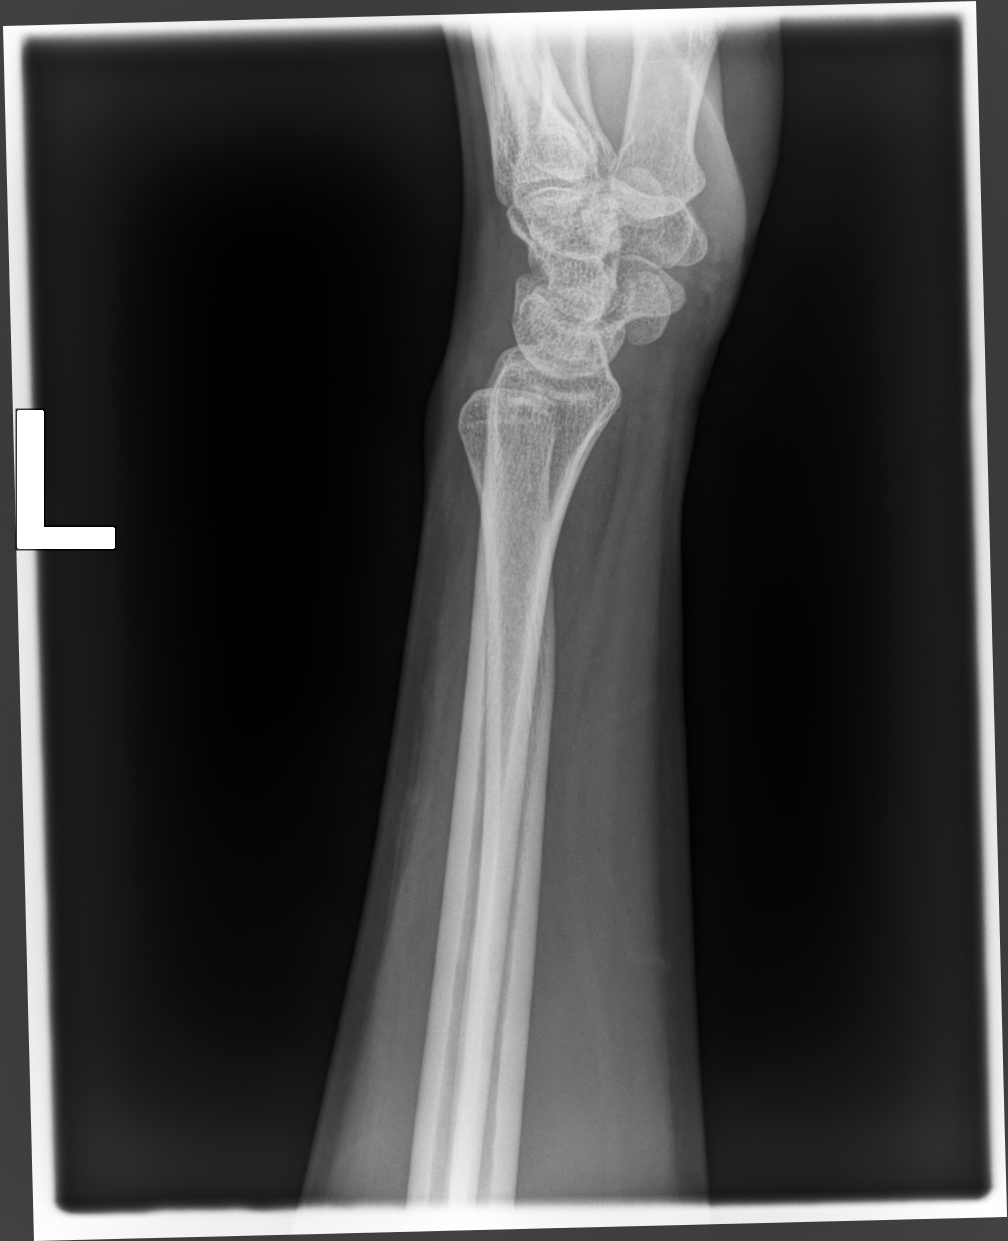

[3 of 3 positions shown; findings below may reference images not displayed]

FINDINGS: There is no evidence of fracture or dislocation. There is no
evidence of arthropathy or other focal bone abnormality. Soft
tissues are unremarkable.
IMPRESSION: No fracture or dislocation of the left wrist. The carpus is normally
aligned. Joint spaces are preserved.

## 2021-08-01 ENCOUNTER — Encounter: Payer: Self-pay | Admitting: Emergency Medicine

## 2021-08-01 ENCOUNTER — Emergency Department
Admission: EM | Admit: 2021-08-01 | Discharge: 2021-08-01 | Disposition: A | Discharge status: Home / Self Care | Payer: Medicaid Other | Source: Home / Self Care | Attending: Family Medicine | Admitting: Family Medicine

## 2021-08-01 DIAGNOSIS — J069 Acute upper respiratory infection, unspecified: Secondary | ICD-10-CM

## 2021-08-01 DIAGNOSIS — R519 Headache, unspecified: Secondary | ICD-10-CM

## 2021-08-01 LAB — POC SARS CORONAVIRUS 2 AG -  ED: SARS Coronavirus 2 Ag: NEGATIVE

## 2021-08-01 MED ORDER — KETOROLAC TROMETHAMINE 30 MG/ML IJ SOLN
30.0000 mg | Freq: Once | INTRAMUSCULAR | Status: AC
  Administered 2021-08-01: 30 mg via INTRAMUSCULAR

## 2021-08-01 MED ORDER — IBUPROFEN 600 MG PO TABS: 600.0000 mg | ORAL_TABLET | Freq: Four times a day (QID) | ORAL | 0 refills | Status: AC | PRN

## 2021-08-01 MED ORDER — CLARITIN-D 12 HOUR 5-120 MG PO TB12: 1.0000 | ORAL_TABLET | Freq: Two times a day (BID) | ORAL | 0 refills | Status: AC

## 2021-08-01 NOTE — Discharge Instructions (Signed)
Home to rest  drink lots of water  Take the ibuprofen as needed for headache pain Take Claritin-D or generic for the cold symptoms Call if not improving in a few days

## 2021-08-01 NOTE — ED Provider Notes (Signed)
[facility]    CSN: [id number] Arrival date & time: 08/01/21  0841      History   Chief Complaint Chief Complaint  Patient presents with   Headache    HPI Karla Cross is a 21 y.o. female.   HPI  Patient works as a Social worker.  She states she has been having night sweats fatigue dry eyes loss of taste and smell and headache for 2 days.  She tried some diphenhydramine to sleep.  She has some underlying asthma but does not feel short of breath  Past Medical History:  Diagnosis Date   Asthma     There are no problems to display for this patient.   History reviewed. No pertinent surgical history.  OB History   No obstetric history on file.      Home Medications    Prior to Admission medications   Medication Sig Start Date End Date Taking? Authorizing Provider  FLOVENT HFA 110 MCG/ACT inhaler SMARTSIG:2 Puff(s) By Mouth Twice Daily 05/05/20  Yes [provider]  loratadine-pseudoephedrine (CLARITIN-D 12 HOUR) 5-120 MG tablet Take 1 tablet by mouth 2 (two) times daily. 08/01/21  Yes Eustace Moore, MD  PROAIR HFA 108 [phone] Base) MCG/ACT inhaler SMARTSIG:2 Puff(s) By Mouth Every 6 Hours 05/07/20  Yes [provider]  ibuprofen (ADVIL) 600 MG tablet Take 1 tablet (600 mg total) by mouth every 6 (six) hours as needed for headache. 08/01/21   Eustace Moore, MD  dicyclomine (BENTYL) 20 MG tablet Take 1 tablet (20 mg total) by mouth 2 (two) times daily. 02/05/19 04/14/20  Durward Parcel, FNP    Family History Family History  Problem Relation Age of Onset   Healthy Mother     Social History Social History   Tobacco Use   Smoking status: Some Days    Types: Cigarettes   Smokeless tobacco: Never  Vaping Use   Vaping Use: Former  Substance Use Topics   Alcohol use: Never   Drug use: Never     Allergies   Patient has no known allergies.   Review of Systems Review of Systems   Physical Exam Triage Vital Signs ED Triage  Vitals [08/01/21 0900]  Enc Vitals Group     BP 125/81     Pulse Rate 71     Resp 18     Temp 98.1 F (36.7 C)     Temp Source Oral     SpO2 100 %     Weight 130 lb (59 kg)     Height 5\' 3"  (1.6 m)     Head Circumference      Peak Flow      Pain Score 7     Pain Loc      Pain Edu?      Excl. in GC?    No data found.  Updated Vital Signs BP 125/81 (BP Location: Right Arm)   Pulse 71   Temp 98.1 F (36.7 C) (Oral)   Resp 18   Ht 5\' 3"  (1.6 m)   Wt 59 kg   LMP 07/25/2021   SpO2 100%   BMI 23.03 kg/m       Physical Exam Constitutional:      General: She is not in acute distress.    Appearance: She is well-developed. She is ill-appearing.  HENT:     Head: Normocephalic and atraumatic.  Eyes:     Conjunctiva/sclera: Conjunctivae normal.     Pupils: Pupils are equal,  round, and reactive to light.  Cardiovascular:     Rate and Rhythm: Normal rate and regular rhythm.     Heart sounds: Normal heart sounds.  Pulmonary:     Effort: Pulmonary effort is normal. No respiratory distress.     Breath sounds: Normal breath sounds. No wheezing or rhonchi.  Abdominal:     General: There is no distension.     Palpations: Abdomen is soft.  Musculoskeletal:        General: Normal range of motion.     Cervical back: Normal range of motion.  Skin:    General: Skin is warm and dry.  Neurological:     General: No focal deficit present.     Mental Status: She is alert.  Psychiatric:        Mood and Affect: Mood normal.        Behavior: Behavior normal.      UC Treatments / Results  Labs (all labs ordered are listed, but only abnormal results are displayed) Labs Reviewed  POC SARS CORONAVIRUS 2 AG -  ED    EKG   Radiology No results found.  Procedures Procedures (including critical care time)  Medications Ordered in UC Medications  ketorolac (TORADOL) 30 MG/ML injection 30 mg (30 mg Intramuscular Given 08/01/21 1016)    Initial Impression / Assessment and  Plan / UC Course  I have reviewed the triage vital signs and the nursing notes.  Pertinent labs & imaging results that were available during my care of the patient were reviewed by me and considered in my medical decision making (see chart for details).     Viral illness.  COVID negative Final Clinical Impressions(s) / UC Diagnoses   Final diagnoses:  Viral upper respiratory tract infection  Bad headache     Discharge Instructions      Home to rest  drink lots of water  Take the ibuprofen as needed for headache pain Take Claritin-D or generic for the cold symptoms Call if not improving in a few days   ED Prescriptions     Medication Sig Dispense Auth. Provider   ibuprofen (ADVIL) 600 MG tablet Take 1 tablet (600 mg total) by mouth every 6 (six) hours as needed for headache. 30 tablet Eustace Moore, MD   loratadine-pseudoephedrine (CLARITIN-D 12 HOUR) 5-120 MG tablet Take 1 tablet by mouth 2 (two) times daily. 24 tablet Eustace Moore, MD      PDMP not reviewed this encounter.   Eustace Moore, MD 08/01/21 1416

## 2021-08-01 NOTE — ED Triage Notes (Signed)
Patient c/o headache, night sweats, dry eyes, loss taste and smell x 2 days.  Patient has taken Zyquil.

## 2023-08-24 ENCOUNTER — Emergency Department (HOSPITAL_BASED_OUTPATIENT_CLINIC_OR_DEPARTMENT_OTHER)

## 2023-08-24 ENCOUNTER — Emergency Department (HOSPITAL_BASED_OUTPATIENT_CLINIC_OR_DEPARTMENT_OTHER): Admission: EM | Admit: 2023-08-24 | Discharge: 2023-08-24 | Disposition: A | Discharge status: Home / Self Care

## 2023-08-24 ENCOUNTER — Other Ambulatory Visit: Payer: Self-pay

## 2023-08-24 ENCOUNTER — Encounter (HOSPITAL_BASED_OUTPATIENT_CLINIC_OR_DEPARTMENT_OTHER): Payer: Self-pay

## 2023-08-24 DIAGNOSIS — R300 Dysuria: Secondary | ICD-10-CM | POA: Insufficient documentation

## 2023-08-24 DIAGNOSIS — R102 Pelvic and perineal pain: Secondary | ICD-10-CM | POA: Insufficient documentation

## 2023-08-24 LAB — BASIC METABOLIC PANEL WITH GFR
Anion gap: 12 (ref 5–15)
BUN: 14 mg/dL (ref 6–20)
CO2: 25 mmol/L (ref 22–32)
Calcium: 9.4 mg/dL (ref 8.9–10.3)
Chloride: 107 mmol/L (ref 98–111)
Creatinine, Ser: 0.6 mg/dL (ref 0.44–1.00)
GFR, Estimated: >60 mL/min (ref >60)
Glucose, Bld: 91 mg/dL (ref 70–99)
Potassium: 3.9 mmol/L (ref 3.5–5.1)
Sodium: 143 mmol/L (ref 135–145)

## 2023-08-24 LAB — PREGNANCY, URINE: Preg Test, Ur: NEGATIVE

## 2023-08-24 LAB — URINALYSIS, MICROSCOPIC (REFLEX)

## 2023-08-24 LAB — WET PREP, GENITAL
Clue Cells Wet Prep HPF POC: NONE SEEN
Sperm: NONE SEEN
Trich, Wet Prep: NONE SEEN
WBC, Wet Prep HPF POC: <10 (ref <10)
Yeast Wet Prep HPF POC: NONE SEEN

## 2023-08-24 LAB — URINALYSIS, ROUTINE W REFLEX MICROSCOPIC
Bilirubin Urine: NEGATIVE
Glucose, UA: NEGATIVE mg/dL
Ketones, ur: NEGATIVE mg/dL
Leukocytes,Ua: NEGATIVE
Nitrite: NEGATIVE
Protein, ur: NEGATIVE mg/dL
Specific Gravity, Urine: 1.02 (ref 1.005–1.030)
pH: 6.5 (ref 5.0–8.0)

## 2023-08-24 LAB — CBC
HCT: 37.5 % (ref 36.0–46.0)
Hemoglobin: 12.1 g/dL (ref 12.0–15.0)
MCH: 27.4 pg (ref 26.0–34.0)
MCHC: 32.3 g/dL (ref 30.0–36.0)
MCV: 84.8 fL (ref 80.0–100.0)
Platelets: 285 K/uL (ref 150–400)
RBC: 4.42 MIL/uL (ref 3.87–5.11)
RDW: 13.3 % (ref 11.5–15.5)
WBC: 4.6 K/uL (ref 4.0–10.5)
nRBC: 0 % (ref 0.0–0.2)

## 2023-08-24 NOTE — ED Triage Notes (Signed)
 Pt presents with severe pelvic pain that started yesterday concurrent with the onset of her menstrual cycle. She reports the pain is out of proportion to her usual cramping during her cycle. She did have some N/V, but that is typical for her during her cycle. Denies fever chills, or abnormal vaginal discharge or bleeding.

## 2023-08-24 NOTE — ED Provider Notes (Signed)
 [place] [facility] AT [facility] Provider Note   CSN: [id number] Arrival date & time: 08/24/23  9095     Patient presents with: Pelvic Pain   Karla Cross is a 23 y.o. female.   This is a 23 year old female presenting emergency department with pelvic pain.  Reports that she started her menstrual period yesterday.  Pain different more severe, diffusely across pelvis.  Normal vaginal bleeding.  No discharge.  Does note some dysuria.  Low suspicion for STI.  Has never been pregnant.   Pelvic Pain       Prior to Admission medications   Medication Sig Start Date End Date Taking? Authorizing Provider  FLOVENT HFA 110 MCG/ACT inhaler SMARTSIG:2 Puff(s) By Mouth Twice Daily 05/05/20   [provider]  ibuprofen  (ADVIL ) 600 MG tablet Take 1 tablet (600 mg total) by mouth every 6 (six) hours as needed for headache. 08/01/21   Maranda Jamee Jacob, MD  loratadine -pseudoephedrine (CLARITIN -D 12 HOUR) 5-120 MG tablet Take 1 tablet by mouth 2 (two) times daily. 08/01/21   Maranda Jamee Jacob, MD  PROAIR HFA 108 [phone] Base) MCG/ACT inhaler SMARTSIG:2 Puff(s) By Mouth Every 6 Hours 05/07/20   [provider]  dicyclomine  (BENTYL ) 20 MG tablet Take 1 tablet (20 mg total) by mouth 2 (two) times daily. 02/05/19 04/14/20  Avegno, Komlanvi S, FNP    Allergies: Patient has no known allergies.    Review of Systems  Genitourinary:  Positive for pelvic pain.    Updated Vital Signs BP 119/78 (BP Location: Right Arm)   Pulse 77   Temp 98.4 F (36.9 C) (Oral)   Resp 18   Ht 5' 3 (1.6 m)   Wt 63.5 kg   LMP 08/23/2023   SpO2 100%   BMI 24.80 kg/m   Physical Exam Vitals and nursing note reviewed.  Constitutional:      General: She is not in acute distress.    Appearance: She is not toxic-appearing.  HENT:     Head: Normocephalic.     Nose: Nose normal.     Mouth/Throat:     Mouth: Mucous membranes are moist.  Eyes:     Conjunctiva/sclera: Conjunctivae  normal.  Cardiovascular:     Rate and Rhythm: Normal rate and regular rhythm.  Pulmonary:     Effort: Pulmonary effort is normal.     Breath sounds: Normal breath sounds.  Abdominal:     General: Abdomen is flat. There is no distension.     Palpations: Abdomen is soft.     Tenderness: There is abdominal tenderness (mild suprapubic). There is no guarding or rebound.  Musculoskeletal:        General: Normal range of motion.  Skin:    General: Skin is warm and dry.     Capillary Refill: Capillary refill takes less than 2 seconds.  Neurological:     Mental Status: She is alert and oriented to person, place, and time.  Psychiatric:        Mood and Affect: Mood normal.        Behavior: Behavior normal.     (all labs ordered are listed, but only abnormal results are displayed) Labs Reviewed  URINALYSIS, ROUTINE W REFLEX MICROSCOPIC - Abnormal; Notable for the following components:      Result Value   Hgb urine dipstick LARGE (*)    All other components within normal limits  URINALYSIS, MICROSCOPIC (REFLEX) - Abnormal; Notable for the following components:   Bacteria, UA FEW (*)  All other components within normal limits  WET PREP, GENITAL  PREGNANCY, URINE  CBC  BASIC METABOLIC PANEL WITH GFR    EKG: None  Radiology: US  Pelvis Complete Result Date: 08/24/2023 CLINICAL DATA:  ruptured ovarian cyst vs torsion; 355246 Abdominal pain [id number]. EXAM: TRANSABDOMINAL AND TRANSVAGINAL ULTRASOUND OF PELVIS DOPPLER ULTRASOUND OF OVARIES TECHNIQUE: Both transabdominal and transvaginal ultrasound examinations of the pelvis were performed. Transabdominal technique was performed for global imaging of the pelvis including uterus, ovaries, adnexal regions, and pelvic cul-de-sac. It was necessary to proceed with endovaginal exam following the transabdominal exam to visualize the uterus, endometrium, bilateral ovaries and bilateral adnexa. Color and duplex Doppler ultrasound was utilized to  evaluate blood flow to the ovaries. COMPARISON:  None Available. FINDINGS: Uterus Measurements: 2.6 x 4.1 x 7.0 cm = volume: 39.0 mL. No fibroids or other mass visualized. Endometrium Thickness: 6 mm.  No focal abnormality visualized. Right ovary Measurements: 1.8 x 2.1 x 3.1 cm = volume: 6.1 mL. Normal appearance/no adnexal mass. Left ovary Measurements: 2.0 x 2.2 x 3.7 cm = volume: 8.5 mL. Normal appearance/no adnexal mass. Pulsed Doppler evaluation of both ovaries demonstrates normal low-resistance arterial and venous waveforms. Other findings No abnormal free fluid. IMPRESSION: *Unremarkable pelvic ultrasound exam. Electronically Signed   By: Ree Molt M.D.   On: 08/24/2023 11:42   US  PELVIC DOPPLER (TORSION R/O OR MASS ARTERIAL FLOW) Result Date: 08/24/2023 CLINICAL DATA:  ruptured ovarian cyst vs torsion; 355246 Abdominal pain [id number]. EXAM: TRANSABDOMINAL AND TRANSVAGINAL ULTRASOUND OF PELVIS DOPPLER ULTRASOUND OF OVARIES TECHNIQUE: Both transabdominal and transvaginal ultrasound examinations of the pelvis were performed. Transabdominal technique was performed for global imaging of the pelvis including uterus, ovaries, adnexal regions, and pelvic cul-de-sac. It was necessary to proceed with endovaginal exam following the transabdominal exam to visualize the uterus, endometrium, bilateral ovaries and bilateral adnexa. Color and duplex Doppler ultrasound was utilized to evaluate blood flow to the ovaries. COMPARISON:  None Available. FINDINGS: Uterus Measurements: 2.6 x 4.1 x 7.0 cm = volume: 39.0 mL. No fibroids or other mass visualized. Endometrium Thickness: 6 mm.  No focal abnormality visualized. Right ovary Measurements: 1.8 x 2.1 x 3.1 cm = volume: 6.1 mL. Normal appearance/no adnexal mass. Left ovary Measurements: 2.0 x 2.2 x 3.7 cm = volume: 8.5 mL. Normal appearance/no adnexal mass. Pulsed Doppler evaluation of both ovaries demonstrates normal low-resistance arterial and venous waveforms. Other  findings No abnormal free fluid. IMPRESSION: *Unremarkable pelvic ultrasound exam. Electronically Signed   By: Ree Molt M.D.   On: 08/24/2023 11:42     Procedures   Medications Ordered in the ED - No data to display  Clinical Course as of 08/24/23 1152  Fri Aug 24, 2023  1150 Workup largely reassuring no metabolic derangements.  Normal kidney function.  No leukocytosis to suggest systemic infection no anemia.  Wet prep negative.  Pregnancy test negative, ectopic unlikely.  UA not consistent with urinary tract infection.  Ultrasound pelvis without torsion, no ovarian cyst either.  She is still well-appearing benign abdominal exam.  Shared decision making regarding CT scan to evaluate for alternative causes of her symptoms.  I did offer CT scan, but patient declined would like to wait and see if symptoms improve on their own or worsen.  Feel this is reasonable given history, normal vitals and workup today.  Suspect symptoms secondary to her menstrual period rather than acute surgical pathology.  Discussed strict return precautions, patient voiced understanding.  Will discharge in stable condition at this  time. [TY]    Clinical Course User Index [TY] Neysa Caron PARAS, DO                                 Medical Decision Making This is a 23 year old female presenting emergency department for pelvic pain.  She is afebrile nontachycardic, normotensive.  Clinically well-appearing, does not appear to be in distress.  Benign abdominal exam with some mild suprapubic discomfort.  Suspect ovarian cyst last menstrual period pain.  Will get basic labs, UA, wet prep and ultrasound to evaluate for cyst as well as rule out torsion.  Offered pain medications, but patient declined.   See ED course for further MDM/dispo   Amount and/or Complexity of Data Reviewed External Data Reviewed: radiology.    Details: US  pelvis in 2024: Impression  Uterus: 7.19 x 3.18 x 2.57 cm. Anteverted, normal in size and  shape without masses Endometrial stripe: 9.8 mm Fibroids: none seen Endometrial mass: none seen, the endometrium appear secretory/avascular and symmetrical Cervix: normal Bilateral ovaries are slightly enlarged with a PCO appearance The left ovary has a dominant follicle Other findings of note: No free fluid No adnexal masses  Labs: ordered. Decision-making details documented in ED Course. Radiology: ordered and independent interpretation performed.  Risk Decision regarding hospitalization. Diagnosis or treatment significantly limited by social determinants of health.      Final diagnoses:  None    ED Discharge Orders     None          Neysa Caron PARAS, DO 08/24/23 1152

## 2023-08-24 NOTE — ED Notes (Signed)
 Pt is on her menstrual cycle and came in for pain consistent with her menstrual cycle.

## 2023-08-24 NOTE — Discharge Instructions (Addendum)
 Please follow-up with your primary doctor and your gynecologist.  Return immediately felt fevers, chills, lightheadedness, passout, chest pain, shortness of breath, worsening abdominal pain, inability to eat or drink due to nausea vomiting, heavy vaginal bleeding or any new or worsening symptoms that are concerning to you.  You may take over-the-counter medication such as Tylenol alternating with ibuprofen  for pain.

## 2023-08-24 NOTE — ED Notes (Signed)
